# Patient Record
Sex: Male | Born: 1960 | Race: White | Hispanic: No | Marital: Married | Smoking: Never smoker
Health system: Southern US, Community
[De-identification: ages and names within clinical notes are randomized; demographics above are authoritative.]

---

## 2012-12-13 ENCOUNTER — Ambulatory Visit (INDEPENDENT_AMBULATORY_CARE_PROVIDER_SITE_OTHER): Payer: BC Managed Care – PPO | Admitting: Podiatrist

## 2012-12-13 ENCOUNTER — Encounter: Payer: Self-pay | Admitting: Podiatrist

## 2012-12-13 VITALS — BP 120/81 | HR 72 | Resp 18

## 2012-12-13 DIAGNOSIS — B351 Tinea unguium: Secondary | ICD-10-CM

## 2012-12-13 MED ORDER — TERBINAFINE HCL 250 MG PO TABS: 250.0000 mg | ORAL_TABLET | Freq: Every day | ORAL | Status: AC

## 2012-12-13 NOTE — Patient Instructions (Signed)
Onychomycosis/Fungal Toenails  WHAT IS IT? An infection that lies within the keratin of your nail plate that is caused by a fungus.  WHY ME? Fungal infections affect all ages, sexes, races, and creeds.  There may be many factors that predispose you to a fungal infection such as age, coexisting medical conditions such as diabetes, or an autoimmune disease; stress, medications, fatigue, genetics, etc.  Bottom line: fungus thrives in a warm, moist environment and your shoes offer such a location.  IS IT CONTAGIOUS? Theoretically, yes.  You do not want to share shoes, nail clippers or files with someone who has fungal toenails.  Walking around barefoot in the same room or sleeping in the same bed is unlikely to transfer the organism.  It is important to realize, however, that fungus can spread easily from one nail to the next on the same foot.  HOW DO WE TREAT THIS?  There are several ways to treat this condition.  Treatment may depend on many factors such as age, medications, pregnancy, liver and kidney conditions, etc.  It is best to ask your doctor which options are available to you.  1. No treatment.   Unlike many other medical concerns, you can live with this condition.  However for many people this can be a painful condition and may lead to ingrown toenails or a bacterial infection.  It is recommended that you keep the nails cut short to help reduce the amount of fungal nail. 2. Topical treatment.  These range from herbal remedies to prescription strength nail lacquers.  About 40-50% effective, topicals require twice daily application for approximately 9 to 12 months or until an entirely new nail has grown out.  The most effective topicals are medical grade medications available through physicians offices. 3. Oral antifungal medications.  With an 80-90% cure rate, the most common oral medication requires 3 to 4 months of therapy and stays in your system for a year as the new nail grows out.  Oral  antifungal medications do require blood work to make sure it is a safe drug for you.  A liver function panel will be performed prior to starting the medication and after the first month of treatment.  It is important to have the blood work performed to avoid any harmful side effects.  In general, this medication safe but blood work is required. 4. Laser Therapy.  This treatment is performed by applying a specialized laser to the affected nail plate.  This therapy is noninvasive, fast, and non-painful.  It is not covered by insurance and is therefore, out of pocket.   5. Permanent Nail Avulsion.  Removing the entire nail so that a new nail will not grow back.  Preventing Toenail Fungus from Recurring   Sanitize your shoes with Mycomist spray or a similar shoe sanitizer spray.  Follow the instructions on the bottle and dry them outside in the sun or with a hairdryer.  We also recommend repeating the sanitization once weekly in shoes you wear most often.   Throw away any shoes you have worn a significant amount without socks-fungus thrives in a warm moist environment and you want to avoid re-infection after your laser procedure   Bleach your socks with regular or color safe bleach   Change your socks regularly to keep your feet clean and dry (especially if you have sweaty feet)-if sweaty feet are a problem, let your doctor know-there is a great lotion that helps with this problem.   Clean your toenail  clippers with alcohol before you use them if you do your own toenails and make sure to replace [employer] and orange sticks regularly   A Counsellor works great to kill fungus that has gotten in Electrical engineer.

## 2012-12-13 NOTE — Progress Notes (Signed)
   Subjective:    Patient ID: Gary Walker, male    DOB: November 19, 1960, 52 y.o.   MRN: [id number]  HPI Gary Walker presents today for thickened discolored toenails of bilateral feet. He states it's been occurring for a few years and he's tried several over-the-counter remedies with no improvement. He also states that several years ago he took Lamisil and his toenails did improve however after a couple years they became discolored again.  Review of Systems  Constitutional: Negative.   HENT: Negative.   Eyes: Negative.   Respiratory: Negative.   Cardiovascular: Negative.   Gastrointestinal: Negative.   Endocrine: Negative.   Genitourinary: Negative.   Musculoskeletal: Negative.   Skin: Negative.   Allergic/Immunologic: Positive for environmental allergies and food allergies.  Neurological: Negative.   Hematological: Negative.   Psychiatric/Behavioral: Negative.        Objective:   Physical Exam GENERAL APPEARANCE: Alert, conversant. Appropriately groomed. No acute distress.  VASCULAR: Pedal pulses palpable and strong bilateral.  Capillary refill time is immediate to all digits,  Proximal to distal cooling it warm to warm.  Digital hair growth is present bilateral  NEUROLOGIC: sensation is intact epicritically and protectively to 5.07 monofilament at 5/5 sites bilateral.  Light touch is intact bilateral, vibratory sensation intact bilateral, achilles tendon reflex is intact bilateral.  MUSCULOSKELETAL: acceptable muscle strength, tone and stability bilateral.  Intrinsic muscluature intact bilateral.  Rectus appearance of foot and digits noted bilateral.   DERMATOLOGIC: skin color, texture, and turger are within normal limits.  No preulcerative lesions are seen, no interdigital maceration noted.  No open lesions present.  Digital nails are yellowish brownish discoloration friable brittle and clinically mycotic bilateral. He also has some darkish lesions consistent with tinea pedis bilateral feet as  well.      Assessment & Plan:  Onychomycosis  Recommended Lamisil as it seemed to work the first time he should have a positive outcome again. Prescription was written the patient instructed to get this filled at [facility]. Also gave a form for a liver function panel and we will call with the results of the study. Discussed strategies to prevent nail fungus from returning and recommended a antifungal spray as well as a steri shoe sterilizer light.   we will call when we get a light from the [facility] to the [place] office  Marlowe Aschoff DPM

## 2013-07-20 ENCOUNTER — Encounter: Payer: Self-pay | Admitting: Podiatrist

## 2013-10-02 ENCOUNTER — Other Ambulatory Visit: Payer: Self-pay | Admitting: Gastroenterology

## 2013-10-02 DIAGNOSIS — R1032 Left lower quadrant pain: Secondary | ICD-10-CM

## 2013-10-03 ENCOUNTER — Ambulatory Visit
Admission: RE | Admit: 2013-10-03 | Discharge: 2013-10-03 | Disposition: A | Discharge status: Home / Self Care | Payer: BC Managed Care – PPO | Source: Ambulatory Visit | Attending: Gastroenterology | Admitting: Gastroenterology

## 2013-10-03 ENCOUNTER — Encounter (INDEPENDENT_AMBULATORY_CARE_PROVIDER_SITE_OTHER): Payer: Self-pay

## 2013-10-03 DIAGNOSIS — R1032 Left lower quadrant pain: Secondary | ICD-10-CM

## 2013-10-03 IMAGING — CT CT ABD-PELV W/ CM
3 of 5 series · 13 of 36 positions shown, 19 images · IV contrast (READICAT/WATER & [ID] OMNI 300)
Comparison: None.

CLINICAL DATA: Diverticulitis.

EXAM:
CT ABDOMEN AND PELVIS WITH CONTRAST
TECHNIQUE: Multidetector CT imaging of the abdomen and pelvis was performed
using the standard protocol following bolus administration of
intravenous contrast.
CONTRAST:  100mL OMNIPAQUE IOHEXOL 300 MG/ML  SOLN

[Series 3: abd/pelvis with · axial · 0.78mm/px · z∈[-381,-26]mm · 8 of 93 slices shown, 13 images]
[im 11/93  soft-tissue]
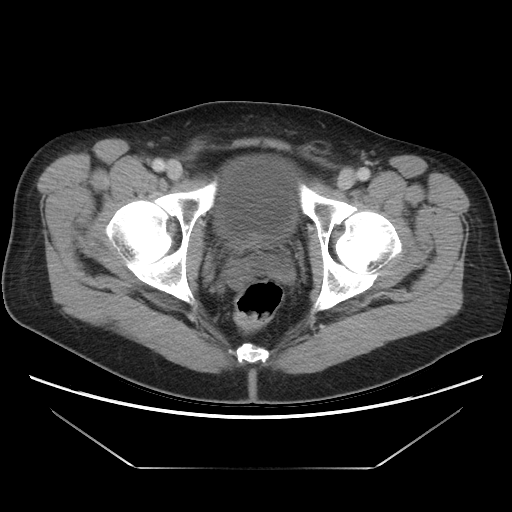
[im 11/93  bone]
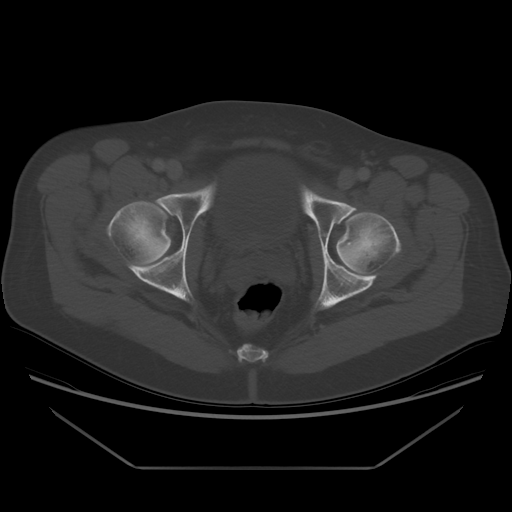
[im 21/93  soft-tissue]
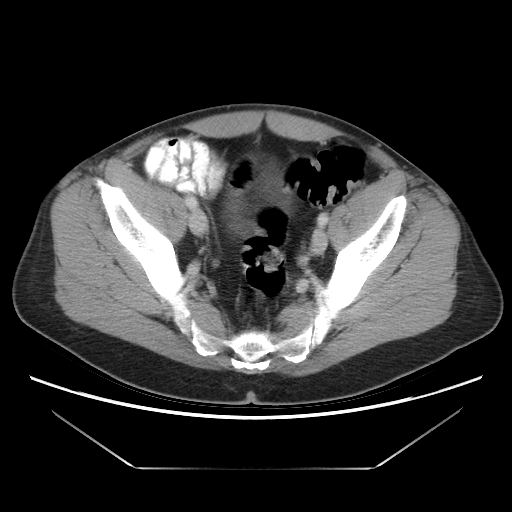
[im 31/93  soft-tissue]
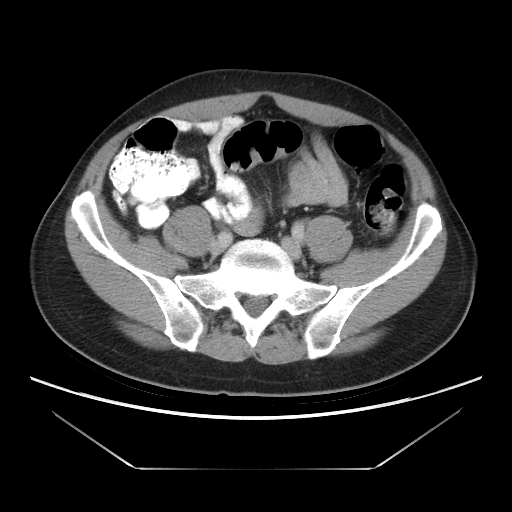
[im 41/93  soft-tissue]
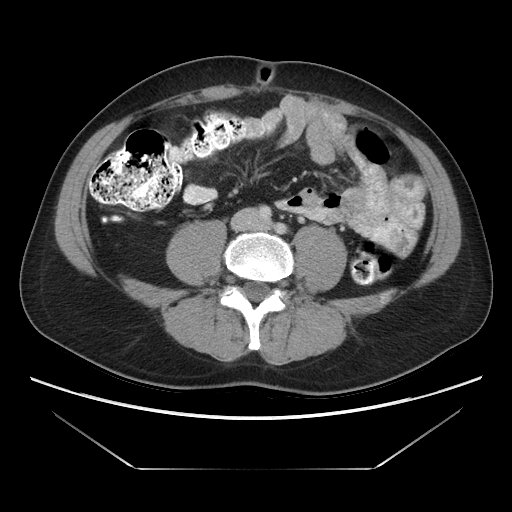
[im 52/93  soft-tissue]
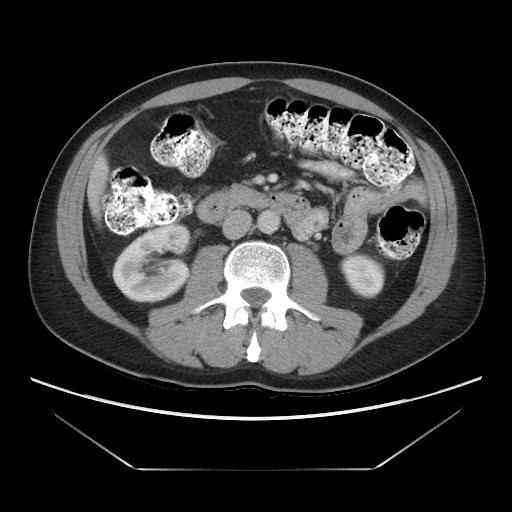
[im 52/93  lung]
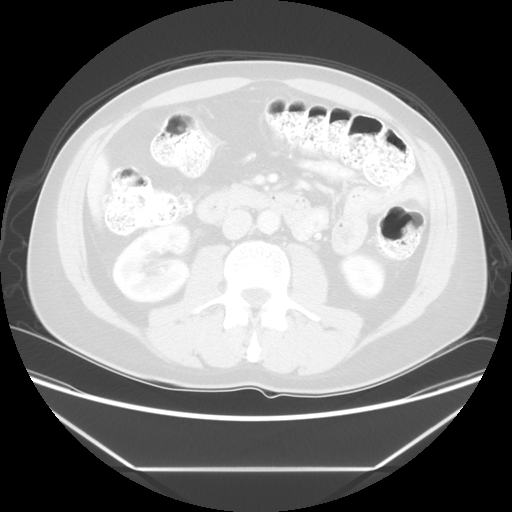
[im 62/93  soft-tissue]
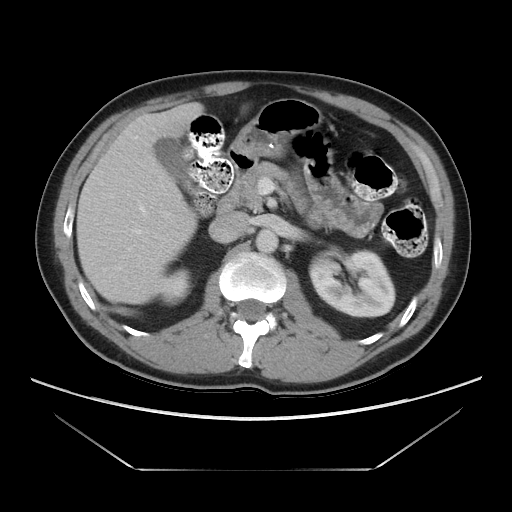
[im 62/93  lung]
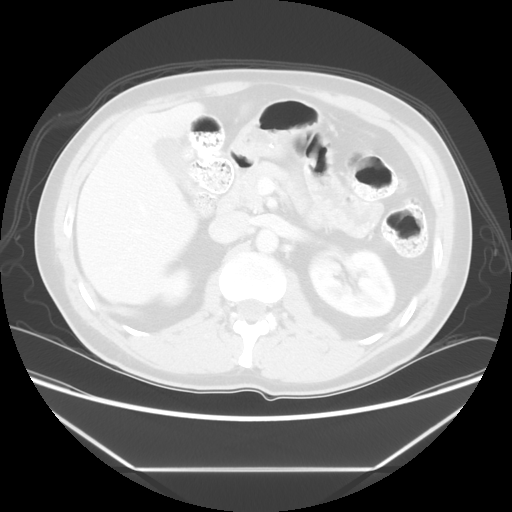
[im 72/93  soft-tissue]
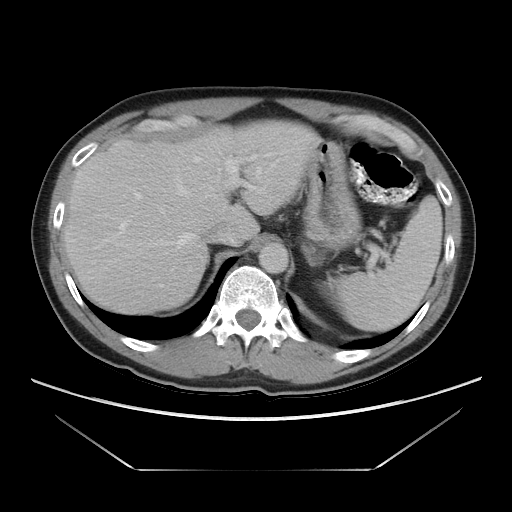
[im 72/93  lung]
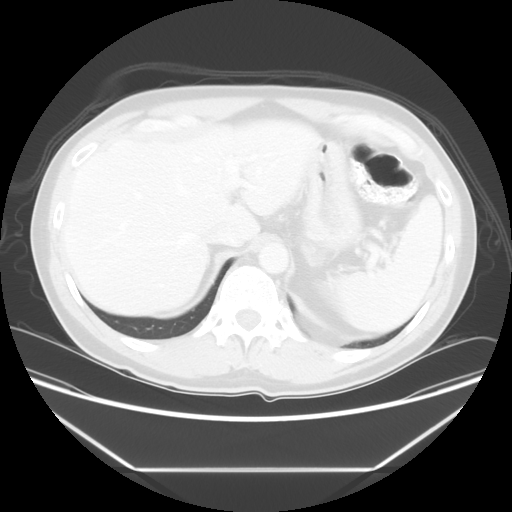
[im 82/93  soft-tissue]
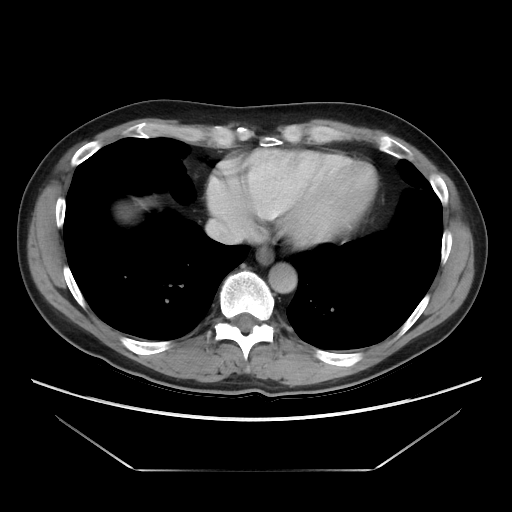
[im 82/93  lung]
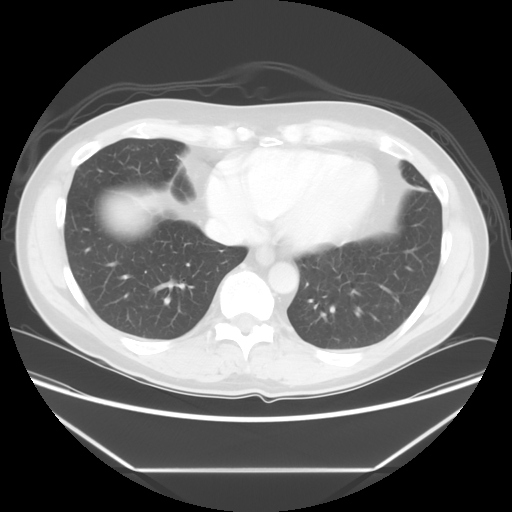

[Series 601: coronal body · coronal · 0.91mm/px · 1 of 112 slices shown, 2 images]
[im 38/112  soft-tissue]
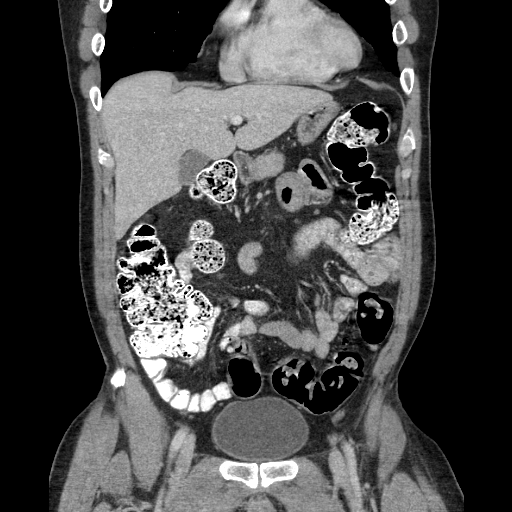
[im 38/112  bone]
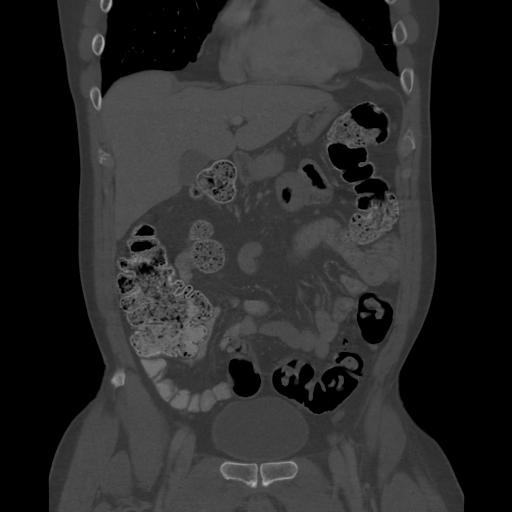

[Series 602: sagittal body · sagittal · 0.91mm/px · 4 of 161 slices shown]
[im 11/161  soft-tissue]
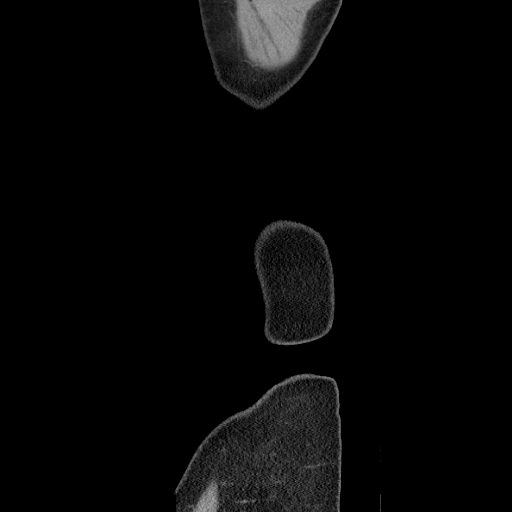
[im 33/161  soft-tissue]
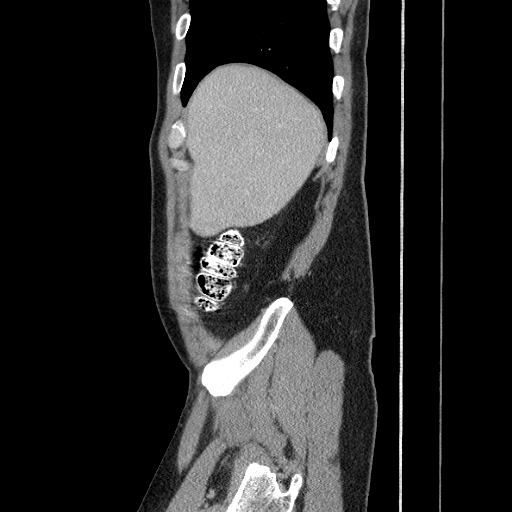
[im 54/161  soft-tissue]
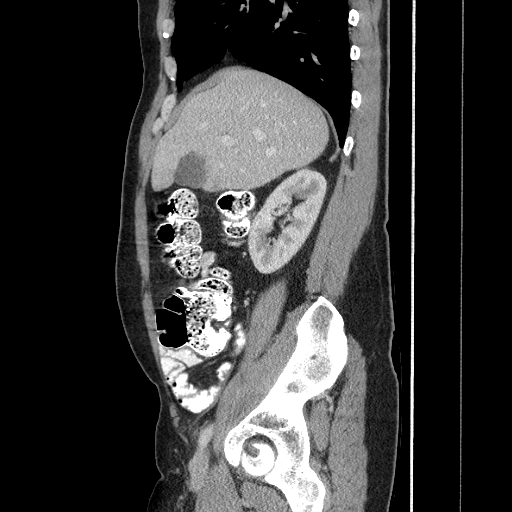
[im 75/161  soft-tissue]
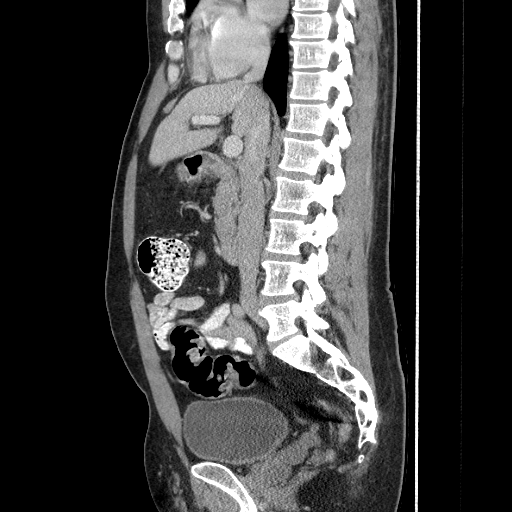

[13 of 36 positions shown; findings below may reference images not displayed]

FINDINGS: Lower chest: Tiny scattered pulmonary nodules are noted. A few of
these were present on the prior CT scan from 0118 others are new.
These are likely benign but do require follow-up. No pleural
effusion. The heart is normal in size. No pericardial effusion.
Small hiatal hernia noted. Small Bochdalek's hernia.

Hepatobiliary: Normal.

Pancreas: Normal.

Spleen: Normal.

Adrenals/Urinary Tract: Normal. Small focus of scarring change at
the upper pole region of the left kidney

Stomach/Bowel: The stomach, duodenum, small bowel and colon are
unremarkable. No inflammatory changes, mass lesions or obstructive
findings. There is moderate air and stool throughout the colon with
mild distention. The terminal ileum is normal. The appendix is
normal.

Vascular/Lymphatic: No mesenteric or retroperitoneal mass or
adenopathy. The aorta and branch vessels are normal.

Other: The bladder, prostate gland and seminal vesicles are
unremarkable. No pelvic mass, adenopathy or free pelvic fluid
collections. No inguinal mass or adenopathy.

Musculoskeletal: No significant osseous findings.
IMPRESSION: 1. Tiny scattered pulmonary nodules noted at both lung bases. Some
of these are new since the prior chest CT. I would recommend a
followup noncontrast chest CT in 4 months to re-evaluate.
2. No acute abdominal/pelvic findings, mass lesions or
lymphadenopathy.
3. Moderate stool throughout the colon but no acute inflammatory
changes.

## 2013-10-03 MED ORDER — IOHEXOL 300 MG/ML  SOLN
100.0000 mL | Freq: Once | INTRAMUSCULAR | Status: AC | PRN
  Administered 2013-10-03: 100 mL via INTRAVENOUS

## 2014-09-17 DIAGNOSIS — K219 Gastro-esophageal reflux disease without esophagitis: Secondary | ICD-10-CM | POA: Insufficient documentation

## 2014-09-17 DIAGNOSIS — J309 Allergic rhinitis, unspecified: Secondary | ICD-10-CM

## 2014-09-17 DIAGNOSIS — J4540 Moderate persistent asthma, uncomplicated: Secondary | ICD-10-CM | POA: Insufficient documentation

## 2014-10-04 ENCOUNTER — Ambulatory Visit (INDEPENDENT_AMBULATORY_CARE_PROVIDER_SITE_OTHER): Payer: BLUE CROSS/BLUE SHIELD | Admitting: Allergy and Immunology

## 2014-10-04 ENCOUNTER — Encounter: Payer: Self-pay | Admitting: Allergy and Immunology

## 2014-10-04 VITALS — BP 130/88 | HR 86 | Resp 16

## 2014-10-04 DIAGNOSIS — J3089 Other allergic rhinitis: Secondary | ICD-10-CM | POA: Diagnosis not present

## 2014-10-04 DIAGNOSIS — J4541 Moderate persistent asthma with (acute) exacerbation: Secondary | ICD-10-CM

## 2014-10-04 DIAGNOSIS — J4520 Mild intermittent asthma, uncomplicated: Secondary | ICD-10-CM

## 2014-10-04 MED ORDER — METHYLPREDNISOLONE ACETATE 80 MG/ML IJ SUSP
80.0000 mg | Freq: Once | INTRAMUSCULAR | Status: AC
  Administered 2014-10-04: 80 mg via INTRAMUSCULAR

## 2014-10-04 MED ORDER — AMOXICILLIN-POT CLAVULANATE 875-125 MG PO TABS: 1.0000 | ORAL_TABLET | Freq: Two times a day (BID) | ORAL | Status: DC

## 2014-10-04 NOTE — Progress Notes (Deleted)
Subjective  Gary Walker is a 54 y.o. male [facility] the following:  Problem  Moderate Persistent Asthma   Gary Walker returns today with a three week history of wheezing and coughing and chest congestion associted initially with rhinitis. He added Qvar to his plan about four days into his episode but he did not increase his Symbicort to twice daily once he became sick. He had malaise and just felt bad for a few weeks and now he has some persistent deep nasal congestion and phantosmia. He still uses his SABA multiple times this week. Prior to this episode he was doing great without any exercise limitation or need for SABA while consistently using his medications and immunotherapy.   Allergic Rhinitis   Did great until his recent episode of rhinitis / sinusitis. Immunotherapy appeared to be working well and he went through the spring and summer without any difficulty.     History reviewed. No pertinent past medical history.  History reviewed. No pertinent past surgical history.    Medication List       This list is accurate as of: 10/04/14  5:46 PM.  Always use your most recent med list.               amoxicillin 875 MG tablet  Commonly known as:  AMOXIL     amoxicillin-clavulanate 875-125 MG tablet  Commonly known as:  AUGMENTIN  Take 1 tablet by mouth 2 (two) times daily.     budesonide-formoterol 160-4.5 MCG/ACT inhaler  Commonly known as:  SYMBICORT  Inhale 2 puffs into the lungs daily.     cetirizine 10 MG tablet  Commonly known as:  ZYRTEC  Take 10 mg by mouth daily.     EPIPEN 2-PAK 0.3 mg/0.3 mL Soaj injection  Generic drug:  EPINEPHrine  Inject into the muscle as needed (injection protocol).     fluticasone 50 MCG/ACT nasal spray  Commonly known as:  FLONASE  Place 2 sprays into both nostrils daily.     lansoprazole 30 MG capsule  Commonly known as:  PREVACID  Take 30 mg by mouth daily at 12 noon.     montelukast 10 MG tablet  Commonly known as:  SINGULAIR  Take 10 mg by mouth at bedtime.     PROVENTIL HFA 108 (90 BASE) MCG/ACT inhaler  Generic drug:  albuterol  Inhale 2 puffs into the lungs every 4 (four) hours as needed for wheezing or shortness of breath.     terbinafine 250 MG tablet  Commonly known as:  LAMISIL  Take 1 tablet (250 mg total) by mouth daily.     venlafaxine XR 75 MG 24 hr capsule  Commonly known as:  EFFEXOR-XR       Current Outpatient Prescriptions on File Prior to Visit  Medication Sig Dispense Refill  . albuterol (PROVENTIL HFA) 108 (90 BASE) MCG/ACT inhaler Inhale 2 puffs into the lungs every 4 (four) hours as needed for wheezing or shortness of breath.    . budesonide-formoterol (SYMBICORT) 160-4.5 MCG/ACT inhaler Inhale 2 puffs into the lungs daily.     . cetirizine (ZYRTEC) 10 MG tablet Take 10 mg by mouth daily.    Marland Kitchen EPINEPHrine (EPIPEN 2-PAK) 0.3 mg/0.3 mL IJ SOAJ injection Inject into the muscle as needed (injection protocol).    . fluticasone (FLONASE) 50 MCG/ACT nasal spray Place 2 sprays into both nostrils daily.    . lansoprazole (PREVACID) 30 MG capsule Take 30 mg  by mouth daily at 12 noon.    . montelukast (SINGULAIR) 10 MG tablet Take 10 mg by mouth at bedtime.    Marland Kitchen venlafaxine XR (EFFEXOR-XR) 75 MG 24 hr capsule     . amoxicillin (AMOXIL) 875 MG tablet     . terbinafine (LAMISIL) 250 MG tablet Take 1 tablet (250 mg total) by mouth daily. (Patient not taking: Reported on 10/04/2014) 90 tablet 0   No current facility-administered medications on file prior to visit.   Meds ordered this encounter  Medications  . DISCONTD: amoxicillin-clavulanate (AUGMENTIN) 875-125 MG tablet    Sig: Take 1 tablet by mouth 2 (two) times daily.    Dispense:  14 tablet    Refill:  0  . amoxicillin-clavulanate (AUGMENTIN) 875-125 MG tablet    Sig: Take 1 tablet by mouth 2 (two) times daily.    Dispense:  20 tablet    Refill:  0  . methylPREDNISolone acetate  (DEPO-MEDROL) injection 80 mg    Sig:     Allergies  Allergen Reactions  . Sulfa Antibiotics Other (See Comments)    Fuzzy headed,groggy    ROS   Objective:   Filed Vitals:   10/04/14 1741  BP: 130/88  Pulse: 86  Resp: 16    Physical Exam  Diagnostics:    Spirometry was performed and demonstrated an FEV1 of ***  The patient had an Asthma Control Test with the following results:  .    Assessment and Plan:     Problem List Items Addressed This Visit      Respiratory   Moderate persistent asthma - Primary   Relevant Medications   amoxicillin-clavulanate (AUGMENTIN) 875-125 MG tablet   methylPREDNISolone acetate (DEPO-MEDROL) injection 80 mg (Completed)   Other Relevant Orders   Spirometry with Graph   Allergic rhinitis        1. Depomedrol 80 delivered today  2. Augmentin 875 - one tablet two times per day for 10 days.  3. During "flare up", use a combination of Symbicort 160 2 puffs two times per day plus Qvar 80 two puffs two times per day.  4. Continue all other medications and immunotherapy.  5. Get a flu vaccine  6. Return in 6 months or earlier if there is a problem.

## 2014-10-04 NOTE — Progress Notes (Deleted)
Subjective  Gary Walker is a 54 y.o. male who [facility] of the following:  Problem  Moderate Persistent Asthma   [name] returns today with a three week history of wheezing and coughing and chest congestion associted initially with rhinitis. He added Qvar to his plan about four days into his episode but he did not increase his Symbicort to twice daily once he became sick. He had malaise and just felt bad for a few weeks and now he has some persistent deep nasal congestion and phantosmia. He still uses his SABA multiple times this week. Prior to this episode he was doing great without any exercise limitation or need for SABA while consistently using his medications and immunotherapy.   Allergic Rhinitis   Did great until his recent episode of rhinitis / sinusitis. Immunotherapy appeared to be working well and he went through the spring and summer without any difficulty.     History reviewed. No pertinent past medical history.  History reviewed. No pertinent past surgical history.  Meds ordered this encounter  Medications  . DISCONTD: amoxicillin-clavulanate (AUGMENTIN) 875-125 MG tablet    Sig: Take 1 tablet by mouth 2 (two) times daily.    Dispense:  14 tablet    Refill:  0  . amoxicillin-clavulanate (AUGMENTIN) 875-125 MG tablet    Sig: Take 1 tablet by mouth 2 (two) times daily.    Dispense:  20 tablet    Refill:  0  . methylPREDNISolone acetate (DEPO-MEDROL) injection 80 mg    Sig:       Allergies  Allergen Reactions  . Sulfa Antibiotics Other (See Comments)    Fuzzy headed,groggy    ROS   Objective:   Filed Vitals:   10/04/14 1741  BP: 130/88  Pulse: 86  Resp: 16    Physical Exam  Diagnostics:    Spirometry was performed and demonstrated an FEV1 of ***  The patient had an Asthma Control Test with the following results:  .    Assessment and Plan:     Problem List Items Addressed This Visit      Respiratory    Moderate persistent asthma - Primary   Relevant Medications   amoxicillin-clavulanate (AUGMENTIN) 875-125 MG tablet   methylPREDNISolone acetate (DEPO-MEDROL) injection 80 mg (Completed)   Other Relevant Orders   Spirometry with Graph   Allergic rhinitis    Other Visit Diagnoses    Mild intermittent asthma, uncomplicated        Relevant Medications    methylPREDNISolone acetate (DEPO-MEDROL) injection 80 mg (Completed)    Other Relevant Orders    Spirometry with Graph

## 2014-10-04 NOTE — Patient Instructions (Addendum)
  1. Depomedrol 80 delivered today  2. Augmentin 875 - one tablet two times per day for 10 days.  3. During "flare up", use a combination of Symbicort 160 2 puffs two times per day plus Qvar 80 two puffs two times per day.  4. Continue all other medications and immunotherapy.  5. Get a flu vaccine  6. Return in 6 months or earlier if there is a problem.

## 2014-10-04 NOTE — Progress Notes (Signed)
Subjective  Gary Walker is a 54 y.o. male who [facility] of the following:  Problem  Moderate Persistent Asthma   Gary Walker returns today with a three week history of wheezing and coughing and chest congestion associted initially with rhinitis. He added Qvar to his plan about four days into his episode but he did not increase his Symbicort to twice daily once he became sick. He had malaise and just felt bad for a few weeks and now he has some persistent deep nasal congestion and phantosmia. He still uses his SABA multiple times this week. Prior to this episode he was doing great without any exercise limitation or need for SABA while consistently using his medications and immunotherapy.   Allergic Rhinitis   Did great until his recent episode of rhinitis / sinusitis. Immunotherapy appeared to be working well and he went through the spring and summer without any difficulty.     History reviewed. No pertinent past medical history.  History reviewed. No pertinent past surgical history.  Current Outpatient Prescriptions on File Prior to Visit  Medication Sig Dispense Refill  . albuterol (PROVENTIL HFA) 108 (90 BASE) MCG/ACT inhaler Inhale 2 puffs into the lungs every 4 (four) hours as needed for wheezing or shortness of breath.    . budesonide-formoterol (SYMBICORT) 160-4.5 MCG/ACT inhaler Inhale 2 puffs into the lungs daily.     . cetirizine (ZYRTEC) 10 MG tablet Take 10 mg by mouth daily.    Marland Kitchen EPINEPHrine (EPIPEN 2-PAK) 0.3 mg/0.3 mL IJ SOAJ injection Inject into the muscle as needed (injection protocol).    . fluticasone (FLONASE) 50 MCG/ACT nasal spray Place 2 sprays into both nostrils daily.    . lansoprazole (PREVACID) 30 MG capsule Take 30 mg by mouth daily at 12 noon.    . montelukast (SINGULAIR) 10 MG tablet Take 10 mg by mouth at bedtime.    Marland Kitchen venlafaxine XR (EFFEXOR-XR) 75 MG 24 hr capsule     . amoxicillin (AMOXIL) 875 MG tablet     .  terbinafine (LAMISIL) 250 MG tablet Take 1 tablet (250 mg total) by mouth daily. (Patient not taking: Reported on 10/04/2014) 90 tablet 0   No current facility-administered medications on file prior to visit.    Meds ordered this encounter  Medications  . DISCONTD: amoxicillin-clavulanate (AUGMENTIN) 875-125 MG tablet    Sig: Take 1 tablet by mouth 2 (two) times daily.    Dispense:  14 tablet    Refill:  0  . amoxicillin-clavulanate (AUGMENTIN) 875-125 MG tablet    Sig: Take 1 tablet by mouth 2 (two) times daily.    Dispense:  20 tablet    Refill:  0  . methylPREDNISolone acetate (DEPO-MEDROL) injection 80 mg    Sig:       Allergies  Allergen Reactions  . Sulfa Antibiotics Other (See Comments)    Fuzzy headed,groggy    Review of Systems  Constitutional: Negative for fever, chills, weight loss and malaise/fatigue.  HENT: Positive for congestion. Negative for ear pain, hearing loss, nosebleeds, sore throat and tinnitus.   Eyes: Negative for redness.  Respiratory: Positive for cough and wheezing. Negative for sputum production and shortness of breath.   Cardiovascular: Negative for chest pain and leg swelling.  Gastrointestinal: Negative for heartburn, nausea, vomiting, abdominal pain and diarrhea.  Skin: Negative for itching and rash.  Neurological: Negative for dizziness and headaches.     Objective:   Filed Vitals:   10/04/14 1741  BP: 130/88  Pulse: 86  Resp: 16    Physical Exam  Constitutional: He is well-developed, well-nourished, and in no distress.  HENT:  Head: Normocephalic and atraumatic.  Right Ear: External ear normal.  Left Ear: External ear normal.  Nose: Nose normal.  Mouth/Throat: Oropharynx is clear and moist. No oropharyngeal exudate.  Eyes: Conjunctivae are normal. Pupils are equal, round, and reactive to light. Right eye exhibits no discharge. Left eye exhibits no discharge. No scleral icterus.  Neck: No JVD present. No tracheal deviation  present. No thyromegaly present.  Cardiovascular: Normal rate, regular rhythm and normal heart sounds.  Exam reveals no gallop and no friction rub.   No murmur heard. Pulmonary/Chest: Effort normal. No stridor. No respiratory distress. He has no wheezes. He has no rales. He exhibits no tenderness.  Musculoskeletal: He exhibits no edema or tenderness.  Lymphadenopathy:    He has no cervical adenopathy.  Skin: No rash noted. No erythema. No pallor.    Diagnostics:    Spirometry was performed and demonstrated an FEV1 of 2.99 @ 76% predicted       Assessment and Plan:     Problem List Items Addressed This Visit      Respiratory   Moderate persistent asthma - Primary   Relevant Medications   amoxicillin-clavulanate (AUGMENTIN) 875-125 MG tablet   methylPREDNISolone acetate (DEPO-MEDROL) injection 80 mg (Completed)   Other Relevant Orders   Spirometry with Graph   Allergic rhinitis        1. Depomedrol 80 delivered today  2. Augmentin 875 - one tablet two times per day for 10 days.  3. During "flare up", use a combination of Symbicort 160 2 puffs two times per day plus Qvar 80 two puffs two times per day.  4. Continue all other medications and immunotherapy.  5. Get a flu vaccine  6. Return in 6 months or earlier if there is a problem.

## 2014-10-05 ENCOUNTER — Other Ambulatory Visit: Payer: Self-pay | Admitting: *Deleted

## 2014-10-05 ENCOUNTER — Telehealth: Payer: Self-pay | Admitting: *Deleted

## 2014-10-05 DIAGNOSIS — J4541 Moderate persistent asthma with (acute) exacerbation: Secondary | ICD-10-CM

## 2014-10-05 MED ORDER — BECLOMETHASONE DIPROPIONATE 80 MCG/ACT IN AERS: 2.0000 | INHALATION_SPRAY | Freq: Two times a day (BID) | RESPIRATORY_TRACT | Status: AC

## 2014-10-05 MED ORDER — BECLOMETHASONE DIPROPIONATE 80 MCG/ACT IN AERS: 2.0000 | INHALATION_SPRAY | Freq: Two times a day (BID) | RESPIRATORY_TRACT | Status: DC

## 2014-10-05 MED ORDER — AMOXICILLIN-POT CLAVULANATE 875-125 MG PO TABS: 1.0000 | ORAL_TABLET | Freq: Two times a day (BID) | ORAL | Status: DC

## 2014-10-05 NOTE — Telephone Encounter (Signed)
MEDICATION SENT TO PHARMACY AND PATIENT INFORMED.

## 2014-10-05 NOTE — Telephone Encounter (Signed)
PT DIDN'T GET HIS MEDICATION SENT TO THE PHARMACY YESTERDAY AFTER HIS APPOINTMENT WITH DR. Lucie Leather.   PHARMACY IS [facility] ON [address]. HE WOULD LIKE A RETURN CALL AFTER THE MEDICATION IS SENT IN.

## 2014-10-05 NOTE — Addendum Note (Signed)
Addended by: Redge Gainer on: 10/05/2014 04:20 PM   Modules accepted: Orders

## 2014-10-05 NOTE — Addendum Note (Signed)
Addended by: Redge Gainer on: 10/05/2014 02:12 PM   Modules accepted: Orders

## 2014-10-19 MED ORDER — AMOXICILLIN-POT CLAVULANATE 875-125 MG PO TABS: 1.0000 | ORAL_TABLET | Freq: Two times a day (BID) | ORAL | Status: AC

## 2014-10-19 NOTE — Telephone Encounter (Signed)
Pt seen by Dr. Lucie LeatherKozlow 10/05/14. Was given 10 day rx of Augmentin 875. Dr. Lucie LeatherKozlow told him that it may take 2 courses to resolve his sinus infection. Finished with ABT, still with lots of PND, Sinus pressure and headache. Infection did not clear up. Requesting RF on Augmentin 875.

## 2014-10-19 NOTE — Telephone Encounter (Signed)
Please inform patient that a 10 day course of Augmentin 875 mg tablets to be taken twice a day was electronically sent to his pharmacy.

## 2014-10-19 NOTE — Telephone Encounter (Signed)
Pt. Informed.

## 2014-11-02 ENCOUNTER — Ambulatory Visit (INDEPENDENT_AMBULATORY_CARE_PROVIDER_SITE_OTHER): Payer: BLUE CROSS/BLUE SHIELD | Admitting: *Deleted

## 2014-11-02 DIAGNOSIS — J309 Allergic rhinitis, unspecified: Secondary | ICD-10-CM

## 2014-11-08 DIAGNOSIS — J301 Allergic rhinitis due to pollen: Secondary | ICD-10-CM | POA: Diagnosis not present

## 2014-11-09 DIAGNOSIS — J3089 Other allergic rhinitis: Secondary | ICD-10-CM | POA: Diagnosis not present

## 2014-11-16 ENCOUNTER — Ambulatory Visit (INDEPENDENT_AMBULATORY_CARE_PROVIDER_SITE_OTHER): Payer: BLUE CROSS/BLUE SHIELD

## 2014-11-16 DIAGNOSIS — J309 Allergic rhinitis, unspecified: Secondary | ICD-10-CM

## 2014-12-10 ENCOUNTER — Ambulatory Visit (INDEPENDENT_AMBULATORY_CARE_PROVIDER_SITE_OTHER): Payer: BLUE CROSS/BLUE SHIELD | Admitting: *Deleted

## 2014-12-10 DIAGNOSIS — J309 Allergic rhinitis, unspecified: Secondary | ICD-10-CM

## 2014-12-10 MED ORDER — FLUTICASONE PROPIONATE 50 MCG/ACT NA SUSP: 2.0000 | Freq: Every day | NASAL | Status: AC

## 2014-12-10 MED ORDER — ALBUTEROL SULFATE HFA 108 (90 BASE) MCG/ACT IN AERS: 2.0000 | INHALATION_SPRAY | RESPIRATORY_TRACT | Status: AC | PRN

## 2014-12-10 MED ORDER — BUDESONIDE-FORMOTEROL FUMARATE 160-4.5 MCG/ACT IN AERO: 2.0000 | INHALATION_SPRAY | Freq: Every day | RESPIRATORY_TRACT | Status: AC

## 2014-12-24 ENCOUNTER — Ambulatory Visit (INDEPENDENT_AMBULATORY_CARE_PROVIDER_SITE_OTHER): Payer: BLUE CROSS/BLUE SHIELD | Admitting: *Deleted

## 2014-12-24 DIAGNOSIS — J309 Allergic rhinitis, unspecified: Secondary | ICD-10-CM | POA: Diagnosis not present

## 2015-01-04 ENCOUNTER — Ambulatory Visit (INDEPENDENT_AMBULATORY_CARE_PROVIDER_SITE_OTHER): Payer: BLUE CROSS/BLUE SHIELD | Admitting: *Deleted

## 2015-01-04 DIAGNOSIS — J309 Allergic rhinitis, unspecified: Secondary | ICD-10-CM | POA: Diagnosis not present

## 2015-01-11 ENCOUNTER — Ambulatory Visit (INDEPENDENT_AMBULATORY_CARE_PROVIDER_SITE_OTHER): Payer: BLUE CROSS/BLUE SHIELD | Admitting: *Deleted

## 2015-01-11 DIAGNOSIS — J309 Allergic rhinitis, unspecified: Secondary | ICD-10-CM | POA: Diagnosis not present

## 2015-01-28 ENCOUNTER — Ambulatory Visit (INDEPENDENT_AMBULATORY_CARE_PROVIDER_SITE_OTHER): Payer: BLUE CROSS/BLUE SHIELD | Admitting: *Deleted

## 2015-01-28 DIAGNOSIS — J309 Allergic rhinitis, unspecified: Secondary | ICD-10-CM

## 2015-02-04 ENCOUNTER — Ambulatory Visit (INDEPENDENT_AMBULATORY_CARE_PROVIDER_SITE_OTHER): Payer: BLUE CROSS/BLUE SHIELD

## 2015-02-04 DIAGNOSIS — J309 Allergic rhinitis, unspecified: Secondary | ICD-10-CM

## 2015-02-15 ENCOUNTER — Ambulatory Visit (INDEPENDENT_AMBULATORY_CARE_PROVIDER_SITE_OTHER): Payer: BLUE CROSS/BLUE SHIELD | Admitting: *Deleted

## 2015-02-15 DIAGNOSIS — J309 Allergic rhinitis, unspecified: Secondary | ICD-10-CM

## 2015-02-25 ENCOUNTER — Ambulatory Visit (INDEPENDENT_AMBULATORY_CARE_PROVIDER_SITE_OTHER): Payer: BLUE CROSS/BLUE SHIELD | Admitting: *Deleted

## 2015-02-25 DIAGNOSIS — J309 Allergic rhinitis, unspecified: Secondary | ICD-10-CM | POA: Diagnosis not present

## 2015-03-03 ENCOUNTER — Other Ambulatory Visit: Payer: Self-pay | Admitting: Allergy and Immunology

## 2015-03-04 ENCOUNTER — Ambulatory Visit (INDEPENDENT_AMBULATORY_CARE_PROVIDER_SITE_OTHER): Payer: BLUE CROSS/BLUE SHIELD | Admitting: *Deleted

## 2015-03-04 DIAGNOSIS — J309 Allergic rhinitis, unspecified: Secondary | ICD-10-CM | POA: Diagnosis not present

## 2015-03-18 ENCOUNTER — Ambulatory Visit (INDEPENDENT_AMBULATORY_CARE_PROVIDER_SITE_OTHER): Payer: BLUE CROSS/BLUE SHIELD | Admitting: *Deleted

## 2015-03-18 DIAGNOSIS — J309 Allergic rhinitis, unspecified: Secondary | ICD-10-CM | POA: Diagnosis not present

## 2015-03-25 ENCOUNTER — Ambulatory Visit (INDEPENDENT_AMBULATORY_CARE_PROVIDER_SITE_OTHER): Payer: BLUE CROSS/BLUE SHIELD | Admitting: *Deleted

## 2015-03-25 DIAGNOSIS — J309 Allergic rhinitis, unspecified: Secondary | ICD-10-CM | POA: Diagnosis not present

## 2015-03-28 DIAGNOSIS — J301 Allergic rhinitis due to pollen: Secondary | ICD-10-CM | POA: Diagnosis not present

## 2015-03-29 DIAGNOSIS — J3089 Other allergic rhinitis: Secondary | ICD-10-CM | POA: Diagnosis not present

## 2015-04-01 ENCOUNTER — Ambulatory Visit (INDEPENDENT_AMBULATORY_CARE_PROVIDER_SITE_OTHER): Payer: BLUE CROSS/BLUE SHIELD

## 2015-04-01 DIAGNOSIS — J309 Allergic rhinitis, unspecified: Secondary | ICD-10-CM

## 2015-04-08 ENCOUNTER — Ambulatory Visit (INDEPENDENT_AMBULATORY_CARE_PROVIDER_SITE_OTHER): Payer: BLUE CROSS/BLUE SHIELD | Admitting: *Deleted

## 2015-04-08 DIAGNOSIS — J309 Allergic rhinitis, unspecified: Secondary | ICD-10-CM

## 2015-04-15 ENCOUNTER — Ambulatory Visit (INDEPENDENT_AMBULATORY_CARE_PROVIDER_SITE_OTHER): Payer: BLUE CROSS/BLUE SHIELD | Admitting: *Deleted

## 2015-04-15 DIAGNOSIS — J309 Allergic rhinitis, unspecified: Secondary | ICD-10-CM

## 2015-04-22 ENCOUNTER — Ambulatory Visit (INDEPENDENT_AMBULATORY_CARE_PROVIDER_SITE_OTHER): Payer: BLUE CROSS/BLUE SHIELD | Admitting: *Deleted

## 2015-04-22 DIAGNOSIS — J309 Allergic rhinitis, unspecified: Secondary | ICD-10-CM | POA: Diagnosis not present

## 2015-04-29 ENCOUNTER — Ambulatory Visit (INDEPENDENT_AMBULATORY_CARE_PROVIDER_SITE_OTHER): Payer: BLUE CROSS/BLUE SHIELD | Admitting: *Deleted

## 2015-04-29 DIAGNOSIS — J309 Allergic rhinitis, unspecified: Secondary | ICD-10-CM | POA: Diagnosis not present

## 2015-05-13 ENCOUNTER — Ambulatory Visit (INDEPENDENT_AMBULATORY_CARE_PROVIDER_SITE_OTHER): Payer: BLUE CROSS/BLUE SHIELD | Admitting: *Deleted

## 2015-05-13 DIAGNOSIS — J309 Allergic rhinitis, unspecified: Secondary | ICD-10-CM | POA: Diagnosis not present

## 2015-05-20 ENCOUNTER — Ambulatory Visit (INDEPENDENT_AMBULATORY_CARE_PROVIDER_SITE_OTHER): Payer: BLUE CROSS/BLUE SHIELD | Admitting: *Deleted

## 2015-05-20 DIAGNOSIS — J309 Allergic rhinitis, unspecified: Secondary | ICD-10-CM

## 2015-05-31 ENCOUNTER — Ambulatory Visit (INDEPENDENT_AMBULATORY_CARE_PROVIDER_SITE_OTHER): Payer: BLUE CROSS/BLUE SHIELD | Admitting: *Deleted

## 2015-05-31 DIAGNOSIS — J309 Allergic rhinitis, unspecified: Secondary | ICD-10-CM

## 2015-06-17 ENCOUNTER — Ambulatory Visit (INDEPENDENT_AMBULATORY_CARE_PROVIDER_SITE_OTHER): Payer: BLUE CROSS/BLUE SHIELD | Admitting: *Deleted

## 2015-06-17 DIAGNOSIS — J309 Allergic rhinitis, unspecified: Secondary | ICD-10-CM

## 2015-07-05 ENCOUNTER — Ambulatory Visit (INDEPENDENT_AMBULATORY_CARE_PROVIDER_SITE_OTHER): Payer: BLUE CROSS/BLUE SHIELD | Admitting: *Deleted

## 2015-07-05 DIAGNOSIS — J309 Allergic rhinitis, unspecified: Secondary | ICD-10-CM

## 2015-07-19 ENCOUNTER — Ambulatory Visit (INDEPENDENT_AMBULATORY_CARE_PROVIDER_SITE_OTHER): Payer: BLUE CROSS/BLUE SHIELD | Admitting: *Deleted

## 2015-07-19 DIAGNOSIS — J309 Allergic rhinitis, unspecified: Secondary | ICD-10-CM

## 2015-07-29 ENCOUNTER — Ambulatory Visit (INDEPENDENT_AMBULATORY_CARE_PROVIDER_SITE_OTHER): Payer: BLUE CROSS/BLUE SHIELD

## 2015-07-29 DIAGNOSIS — J309 Allergic rhinitis, unspecified: Secondary | ICD-10-CM | POA: Diagnosis not present

## 2015-08-05 ENCOUNTER — Other Ambulatory Visit: Payer: Self-pay | Admitting: Gastroenterology

## 2015-08-05 DIAGNOSIS — G8929 Other chronic pain: Secondary | ICD-10-CM

## 2015-08-05 DIAGNOSIS — R1032 Left lower quadrant pain: Secondary | ICD-10-CM

## 2015-08-05 DIAGNOSIS — R1031 Right lower quadrant pain: Secondary | ICD-10-CM

## 2015-08-07 ENCOUNTER — Other Ambulatory Visit: Payer: Self-pay | Admitting: Gastroenterology

## 2015-08-07 DIAGNOSIS — R1032 Left lower quadrant pain: Secondary | ICD-10-CM

## 2015-08-07 DIAGNOSIS — R1031 Right lower quadrant pain: Secondary | ICD-10-CM

## 2015-08-07 DIAGNOSIS — R911 Solitary pulmonary nodule: Secondary | ICD-10-CM

## 2015-08-08 ENCOUNTER — Other Ambulatory Visit: Payer: Self-pay | Admitting: Gastroenterology

## 2015-08-08 DIAGNOSIS — R918 Other nonspecific abnormal finding of lung field: Secondary | ICD-10-CM

## 2015-08-12 ENCOUNTER — Ambulatory Visit (INDEPENDENT_AMBULATORY_CARE_PROVIDER_SITE_OTHER): Payer: BLUE CROSS/BLUE SHIELD | Admitting: *Deleted

## 2015-08-12 DIAGNOSIS — J309 Allergic rhinitis, unspecified: Secondary | ICD-10-CM | POA: Diagnosis not present

## 2015-08-14 DIAGNOSIS — J301 Allergic rhinitis due to pollen: Secondary | ICD-10-CM | POA: Diagnosis not present

## 2015-08-15 DIAGNOSIS — J3089 Other allergic rhinitis: Secondary | ICD-10-CM | POA: Diagnosis not present

## 2015-08-16 ENCOUNTER — Ambulatory Visit
Admission: RE | Admit: 2015-08-16 | Discharge: 2015-08-16 | Disposition: A | Discharge status: Home / Self Care | Payer: BLUE CROSS/BLUE SHIELD | Source: Ambulatory Visit | Attending: Gastroenterology | Admitting: Gastroenterology

## 2015-08-16 DIAGNOSIS — R1031 Right lower quadrant pain: Secondary | ICD-10-CM

## 2015-08-16 DIAGNOSIS — R911 Solitary pulmonary nodule: Secondary | ICD-10-CM

## 2015-08-16 DIAGNOSIS — J3081 Allergic rhinitis due to animal (cat) (dog) hair and dander: Secondary | ICD-10-CM | POA: Diagnosis not present

## 2015-08-16 DIAGNOSIS — R1032 Left lower quadrant pain: Secondary | ICD-10-CM

## 2015-08-16 IMAGING — CT CT CHEST W/ CM
2 of 5 series · 13 of 46 positions shown, 15 images · IV contrast (APPLIED)
Comparison: Abdominal pelvic CT 10/03/2013.  Chest CT 01/03/2004

CLINICAL DATA: Left lower quadrant abdominal pain. Follow-up
diverticulitis and pulmonary nodules. No previous relevant surgery
or history of malignancy.

EXAM:
CT CHEST, ABDOMEN, AND PELVIS WITH CONTRAST
TECHNIQUE: Multidetector CT imaging of the chest, abdomen and pelvis was
performed following the standard protocol during bolus
administration of intravenous contrast.
CONTRAST:  100 ml [id number].

[Series 3: cor · coronal · 0.76mm/px · 3 of 82 slices shown]
[im 28/82  soft-tissue]
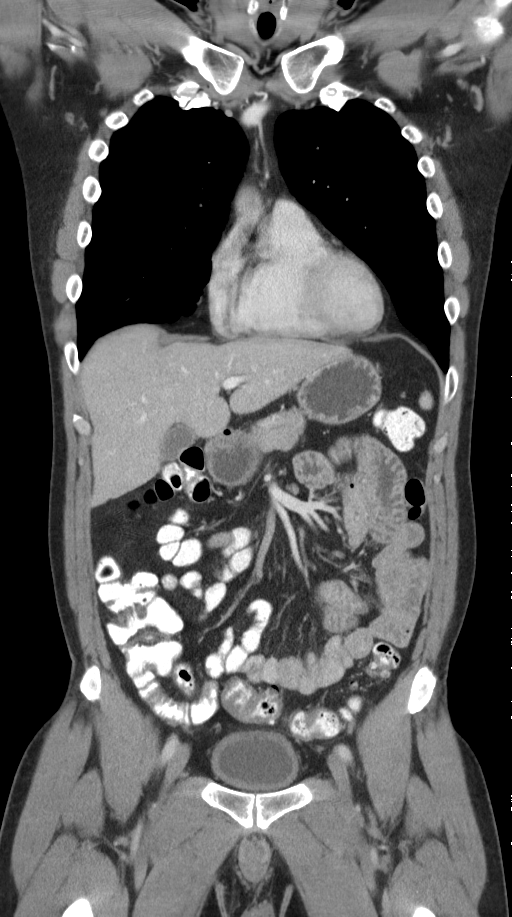
[im 37/82  soft-tissue]
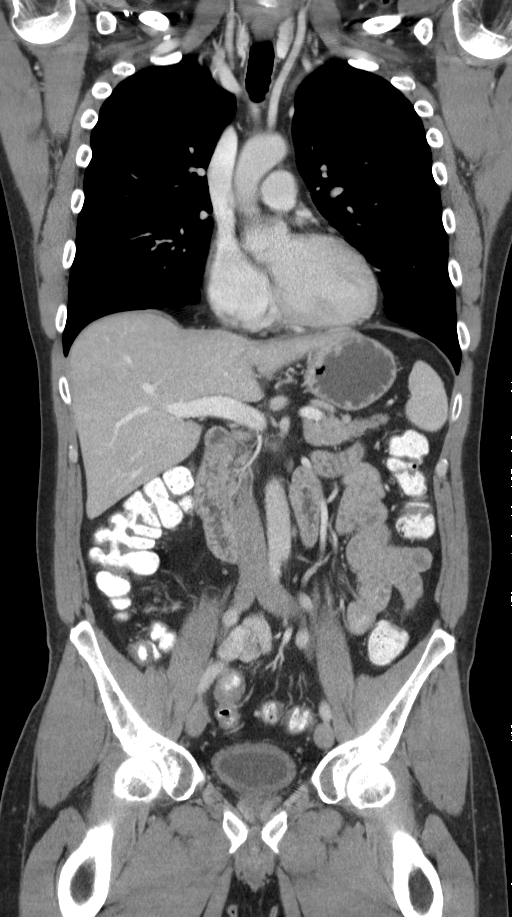
[im 46/82  soft-tissue]
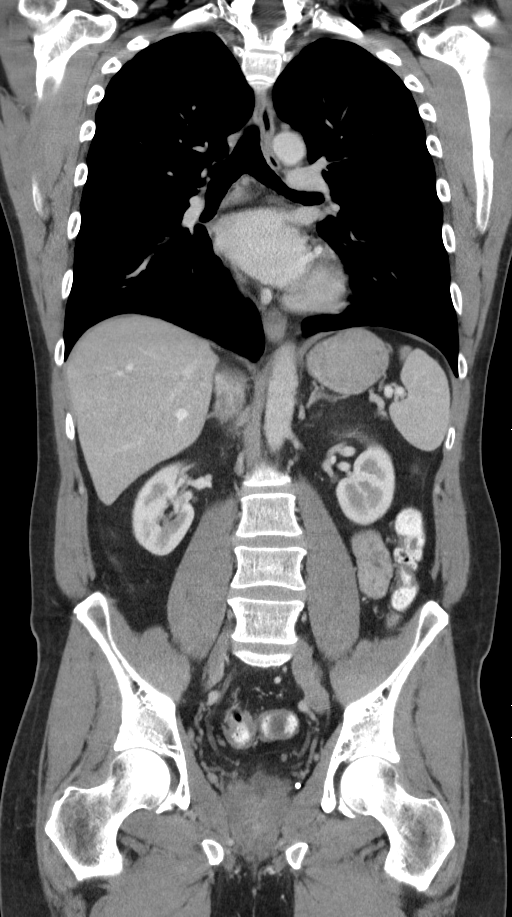

[Series 5: super d · axial · 0.73mm/px · z∈[-29,+223]mm · 10 of 156 slices shown, 12 images]
[im 15/156  soft-tissue]
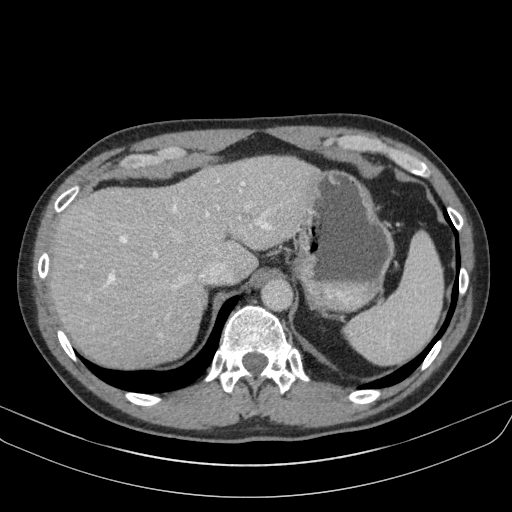
[im 15/156  bone]
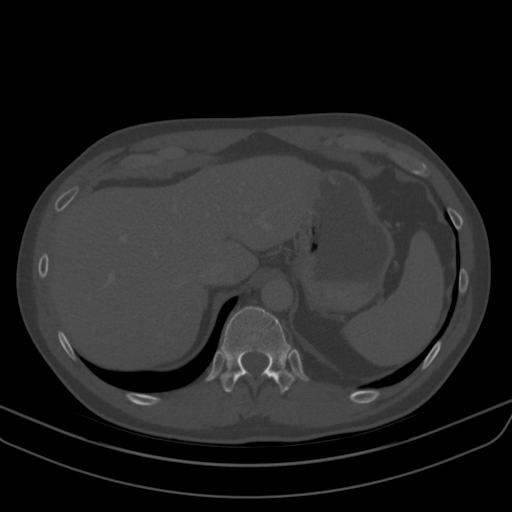
[im 29/156  soft-tissue]
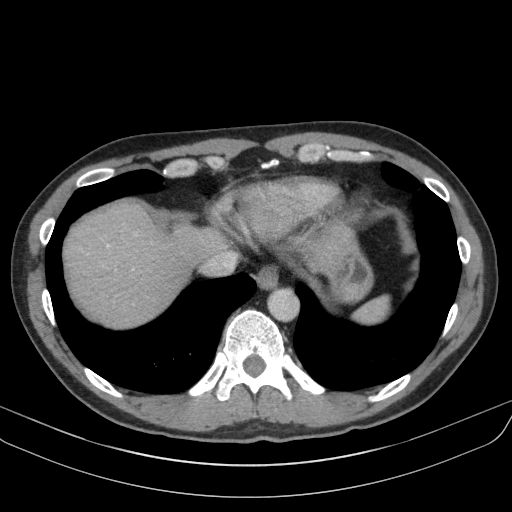
[im 43/156  soft-tissue]
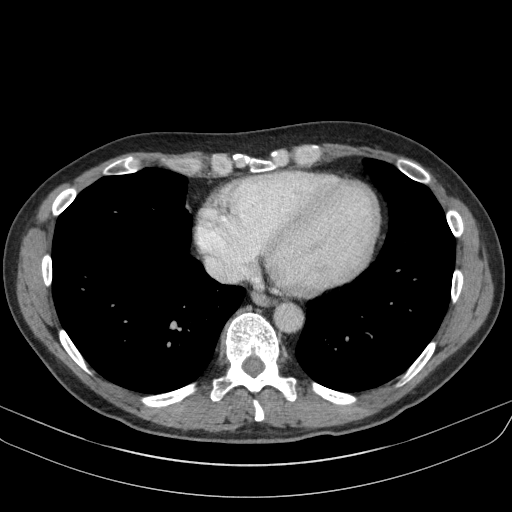
[im 57/156  soft-tissue]
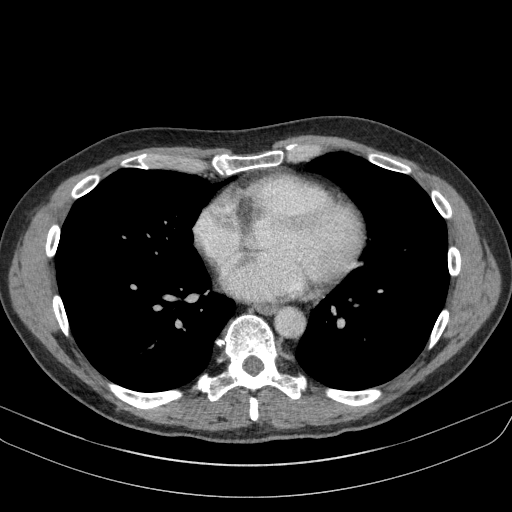
[im 71/156  soft-tissue]
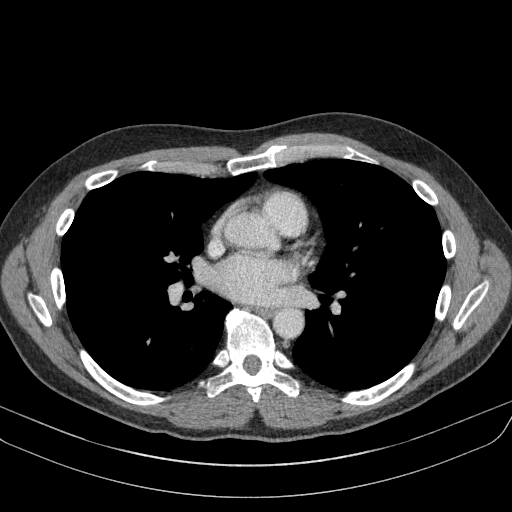
[im 85/156  soft-tissue]
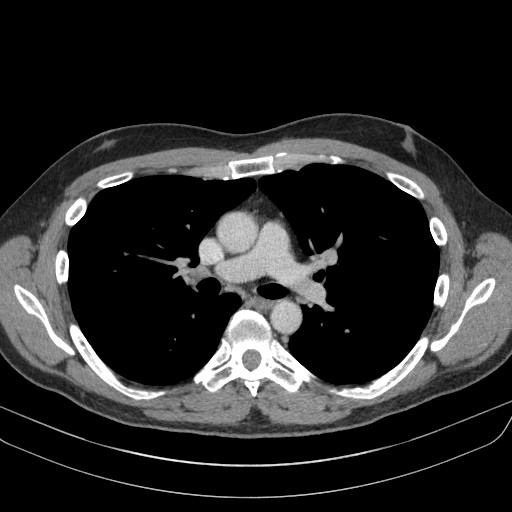
[im 99/156  soft-tissue]
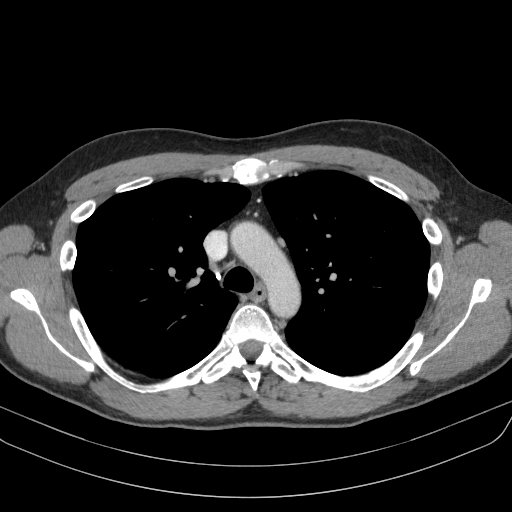
[im 113/156  soft-tissue]
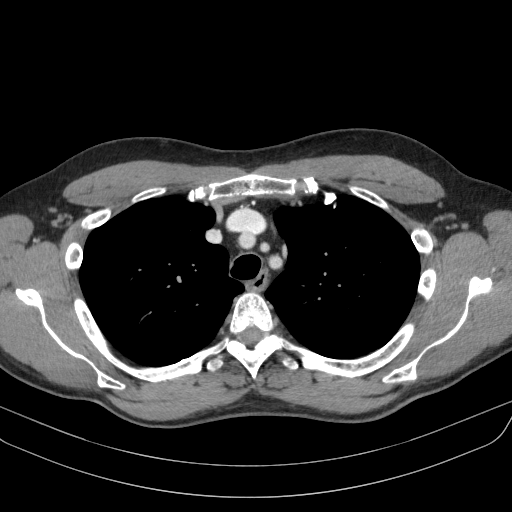
[im 127/156  soft-tissue]
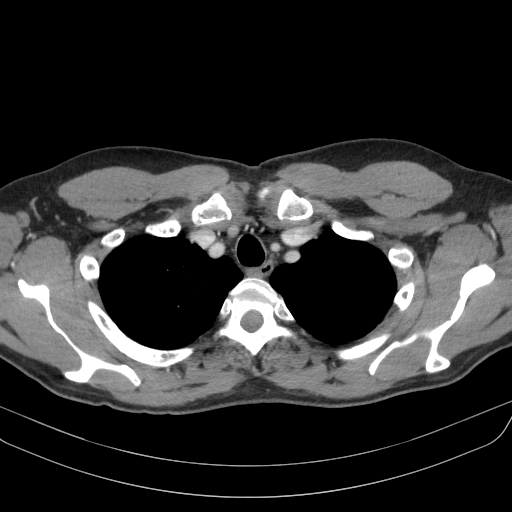
[im 127/156  bone]
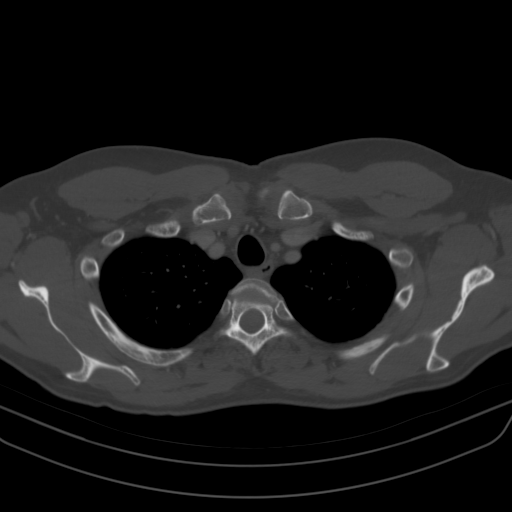
[im 141/156  soft-tissue]
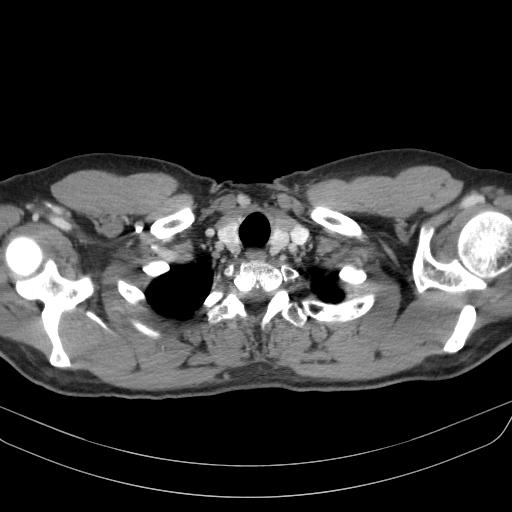

[13 of 46 positions shown; findings below may reference images not displayed]

FINDINGS: CT CHEST FINDINGS

Cardiovascular: There are no significant vascular findings. The
heart size is normal. There is no pericardial effusion.

Mediastinum/Nodes: There are no enlarged mediastinal, hilar or
axillary lymph nodes. The thyroid gland, trachea and esophagus
demonstrate no significant findings.

Lungs/Pleura: There is no pleural effusion. There are few scattered
tiny pulmonary nodules bilaterally which are predominantly
subpleural. No suspicious pulmonary nodules or endobronchial lesion.

Musculoskeletal/Chest wall: No chest wall mass or suspicious osseous
findings. Mild bilateral gynecomastia.

CT ABDOMEN AND PELVIS FINDINGS

Hepatobiliary: The liver is normal in density without focal
abnormality. No evidence of gallstones, gallbladder wall thickening
or biliary dilatation.

Pancreas: Unremarkable. No pancreatic ductal dilatation or
surrounding inflammatory changes.

Spleen: Normal in size without focal abnormality.

Adrenals/Urinary Tract: Both adrenal glands appear normal. There is
stable scarring in the upper pole the left kidney. No evidence of
renal mass, urinary tract calculus or hydronephrosis. Mild diffuse
bladder wall thickening, possibly related to incomplete distention.
No focal abnormality seen.

Stomach/Bowel: The stomach, small bowel, appendix and proximal colon
appear unremarkable. There are diverticular changes of the sigmoid
colon with associated wall thickening, but no definite surrounding
inflammation. There is no evidence of bowel obstruction or
extraluminal fluid collection. There is possibly some wall
thickening in the distal rectum as well.

Vascular/Lymphatic: There are no enlarged abdominal or pelvic lymph
nodes. No significant vascular findings are present.

Reproductive: The prostate gland and seminal vesicles appear
unremarkable.

Other: No evidence of abdominal wall mass or hernia.

Musculoskeletal: No acute or significant osseous findings.
IMPRESSION: 1. Sigmoid diverticulosis with wall thickening, but no definite
surrounding inflammatory change. Possible mild distal rectal wall
thickening as well.
2. No evidence of bowel obstruction, abscess or perforation. The
appendix appears normal.
3. Scattered tiny, predominately subpleural pulmonary nodules,
likely postinflammatory. No suspicious pulmonary nodules or other
thoracic findings.

## 2015-08-16 MED ORDER — IOPAMIDOL (ISOVUE-300) INJECTION 61%: 100.0000 mL | Freq: Once | INTRAVENOUS | Status: DC | PRN

## 2015-09-02 ENCOUNTER — Ambulatory Visit (INDEPENDENT_AMBULATORY_CARE_PROVIDER_SITE_OTHER): Payer: BLUE CROSS/BLUE SHIELD | Admitting: *Deleted

## 2015-09-02 DIAGNOSIS — J309 Allergic rhinitis, unspecified: Secondary | ICD-10-CM | POA: Diagnosis not present

## 2015-09-13 ENCOUNTER — Ambulatory Visit (INDEPENDENT_AMBULATORY_CARE_PROVIDER_SITE_OTHER): Payer: 59 | Admitting: *Deleted

## 2015-09-13 DIAGNOSIS — J309 Allergic rhinitis, unspecified: Secondary | ICD-10-CM | POA: Diagnosis not present

## 2015-09-23 ENCOUNTER — Ambulatory Visit (INDEPENDENT_AMBULATORY_CARE_PROVIDER_SITE_OTHER): Payer: 59 | Admitting: *Deleted

## 2015-09-23 DIAGNOSIS — J309 Allergic rhinitis, unspecified: Secondary | ICD-10-CM | POA: Diagnosis not present

## 2015-10-28 ENCOUNTER — Ambulatory Visit (INDEPENDENT_AMBULATORY_CARE_PROVIDER_SITE_OTHER): Payer: 59 | Admitting: *Deleted

## 2015-10-28 DIAGNOSIS — J309 Allergic rhinitis, unspecified: Secondary | ICD-10-CM | POA: Diagnosis not present

## 2015-11-08 ENCOUNTER — Ambulatory Visit (INDEPENDENT_AMBULATORY_CARE_PROVIDER_SITE_OTHER): Payer: 59

## 2015-11-08 DIAGNOSIS — J309 Allergic rhinitis, unspecified: Secondary | ICD-10-CM | POA: Diagnosis not present

## 2015-12-05 ENCOUNTER — Ambulatory Visit (INDEPENDENT_AMBULATORY_CARE_PROVIDER_SITE_OTHER): Payer: 59 | Admitting: *Deleted

## 2015-12-05 DIAGNOSIS — J309 Allergic rhinitis, unspecified: Secondary | ICD-10-CM

## 2015-12-20 ENCOUNTER — Ambulatory Visit (INDEPENDENT_AMBULATORY_CARE_PROVIDER_SITE_OTHER): Payer: 59

## 2015-12-20 DIAGNOSIS — J309 Allergic rhinitis, unspecified: Secondary | ICD-10-CM

## 2016-01-10 ENCOUNTER — Ambulatory Visit (INDEPENDENT_AMBULATORY_CARE_PROVIDER_SITE_OTHER): Payer: 59 | Admitting: *Deleted

## 2016-01-10 DIAGNOSIS — J309 Allergic rhinitis, unspecified: Secondary | ICD-10-CM

## 2016-01-24 ENCOUNTER — Ambulatory Visit (INDEPENDENT_AMBULATORY_CARE_PROVIDER_SITE_OTHER): Payer: 59 | Admitting: *Deleted

## 2016-01-24 DIAGNOSIS — J309 Allergic rhinitis, unspecified: Secondary | ICD-10-CM | POA: Diagnosis not present

## 2016-01-30 NOTE — Addendum Note (Signed)
Addended by: Berna BueWHITAKER, Valeska Haislip L on: 01/30/2016 11:06 AM   Modules accepted: Orders

## 2016-02-10 ENCOUNTER — Ambulatory Visit (INDEPENDENT_AMBULATORY_CARE_PROVIDER_SITE_OTHER): Payer: 59 | Admitting: *Deleted

## 2016-02-10 DIAGNOSIS — J309 Allergic rhinitis, unspecified: Secondary | ICD-10-CM | POA: Diagnosis not present

## 2016-02-21 ENCOUNTER — Ambulatory Visit (INDEPENDENT_AMBULATORY_CARE_PROVIDER_SITE_OTHER): Payer: 59 | Admitting: *Deleted

## 2016-02-21 DIAGNOSIS — J309 Allergic rhinitis, unspecified: Secondary | ICD-10-CM | POA: Diagnosis not present

## 2016-03-09 ENCOUNTER — Ambulatory Visit (INDEPENDENT_AMBULATORY_CARE_PROVIDER_SITE_OTHER): Payer: 59 | Admitting: *Deleted

## 2016-03-09 DIAGNOSIS — J309 Allergic rhinitis, unspecified: Secondary | ICD-10-CM | POA: Diagnosis not present

## 2016-03-10 DIAGNOSIS — J301 Allergic rhinitis due to pollen: Secondary | ICD-10-CM | POA: Diagnosis not present

## 2016-03-11 DIAGNOSIS — J3089 Other allergic rhinitis: Secondary | ICD-10-CM | POA: Diagnosis not present

## 2016-04-10 ENCOUNTER — Ambulatory Visit (INDEPENDENT_AMBULATORY_CARE_PROVIDER_SITE_OTHER): Payer: 59 | Admitting: *Deleted

## 2016-04-10 DIAGNOSIS — J309 Allergic rhinitis, unspecified: Secondary | ICD-10-CM

## 2016-05-01 ENCOUNTER — Ambulatory Visit (INDEPENDENT_AMBULATORY_CARE_PROVIDER_SITE_OTHER): Payer: 59 | Admitting: *Deleted

## 2016-05-01 DIAGNOSIS — J309 Allergic rhinitis, unspecified: Secondary | ICD-10-CM | POA: Diagnosis not present

## 2016-05-22 ENCOUNTER — Ambulatory Visit (INDEPENDENT_AMBULATORY_CARE_PROVIDER_SITE_OTHER): Payer: 59 | Admitting: *Deleted

## 2016-05-22 DIAGNOSIS — J309 Allergic rhinitis, unspecified: Secondary | ICD-10-CM

## 2016-06-03 NOTE — Addendum Note (Signed)
Addended by: Berna BueWHITAKER, CARRIE L on: 06/03/2016 11:33 AM   Modules accepted: Orders

## 2016-06-12 ENCOUNTER — Ambulatory Visit (INDEPENDENT_AMBULATORY_CARE_PROVIDER_SITE_OTHER): Payer: 59 | Admitting: *Deleted

## 2016-06-12 DIAGNOSIS — J309 Allergic rhinitis, unspecified: Secondary | ICD-10-CM

## 2016-07-03 ENCOUNTER — Ambulatory Visit (INDEPENDENT_AMBULATORY_CARE_PROVIDER_SITE_OTHER): Payer: 59 | Admitting: *Deleted

## 2016-07-03 DIAGNOSIS — J309 Allergic rhinitis, unspecified: Secondary | ICD-10-CM | POA: Diagnosis not present

## 2016-07-17 ENCOUNTER — Ambulatory Visit (INDEPENDENT_AMBULATORY_CARE_PROVIDER_SITE_OTHER): Payer: 59

## 2016-07-17 DIAGNOSIS — J309 Allergic rhinitis, unspecified: Secondary | ICD-10-CM

## 2016-07-30 ENCOUNTER — Ambulatory Visit (INDEPENDENT_AMBULATORY_CARE_PROVIDER_SITE_OTHER): Payer: 59

## 2016-07-30 DIAGNOSIS — J309 Allergic rhinitis, unspecified: Secondary | ICD-10-CM | POA: Diagnosis not present

## 2016-08-14 ENCOUNTER — Ambulatory Visit (INDEPENDENT_AMBULATORY_CARE_PROVIDER_SITE_OTHER): Payer: 59 | Admitting: *Deleted

## 2016-08-14 DIAGNOSIS — J309 Allergic rhinitis, unspecified: Secondary | ICD-10-CM

## 2016-08-31 ENCOUNTER — Ambulatory Visit (INDEPENDENT_AMBULATORY_CARE_PROVIDER_SITE_OTHER): Payer: 59 | Admitting: *Deleted

## 2016-08-31 DIAGNOSIS — J309 Allergic rhinitis, unspecified: Secondary | ICD-10-CM

## 2016-09-04 ENCOUNTER — Telehealth: Payer: Self-pay | Admitting: *Deleted

## 2016-09-04 NOTE — Telephone Encounter (Signed)
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  He will pick up paperwork at [facility] before he leaves and take with him so they can fill same out and we can mail vials.

## 2016-09-04 NOTE — Telephone Encounter (Signed)
Patient advised he will be moving to [place] in [place] and wants to continue his immunotherapy there.  I advised him I would look into allergist there he can go to for injections unfortunately there is none really close by.  I L/M for patient to advise him that he may just want to get PCP there that is willing to do injections.  We can send paperwork for them to fill out accepting responsibility for administering injections and fax to us and we can then mail vials.

## 2016-09-14 ENCOUNTER — Ambulatory Visit (INDEPENDENT_AMBULATORY_CARE_PROVIDER_SITE_OTHER): Payer: 59 | Admitting: *Deleted

## 2016-09-14 DIAGNOSIS — J309 Allergic rhinitis, unspecified: Secondary | ICD-10-CM

## 2016-10-13 ENCOUNTER — Ambulatory Visit (INDEPENDENT_AMBULATORY_CARE_PROVIDER_SITE_OTHER): Payer: Self-pay | Admitting: *Deleted

## 2016-10-13 DIAGNOSIS — J309 Allergic rhinitis, unspecified: Secondary | ICD-10-CM

## 2016-10-13 NOTE — Progress Notes (Signed)
Received auth from [facility] to administer patients allergy injections. Mailed vials 1-grass-tree, 1-dmite-weed, 1-mold-cat to [facility] at [address], [place], [place] [address]. Called and advised patient of same
